# Patient Record
Sex: Female | Born: 1935 | Race: White | Hispanic: No
Health system: Southern US, Community
[De-identification: ages and names within clinical notes are randomized; demographics above are authoritative.]

---

## 2003-11-01 ENCOUNTER — Ambulatory Visit: Payer: Self-pay | Admitting: Internal Medicine

## 2004-02-01 ENCOUNTER — Ambulatory Visit: Payer: Self-pay | Admitting: Oncology

## 2004-02-14 ENCOUNTER — Ambulatory Visit: Payer: Self-pay | Admitting: Oncology

## 2004-07-31 ENCOUNTER — Ambulatory Visit: Payer: Self-pay | Admitting: Oncology

## 2004-08-13 ENCOUNTER — Ambulatory Visit: Payer: Self-pay | Admitting: Oncology

## 2004-11-04 ENCOUNTER — Ambulatory Visit: Payer: Self-pay | Admitting: Internal Medicine

## 2004-12-17 ENCOUNTER — Ambulatory Visit: Payer: Self-pay | Admitting: Internal Medicine

## 2005-02-14 ENCOUNTER — Ambulatory Visit: Payer: Self-pay | Admitting: Oncology

## 2005-02-20 ENCOUNTER — Ambulatory Visit: Payer: Self-pay | Admitting: Gastroenterology

## 2005-03-13 ENCOUNTER — Ambulatory Visit: Payer: Self-pay | Admitting: Oncology

## 2005-05-09 ENCOUNTER — Ambulatory Visit: Payer: Self-pay | Admitting: Gastroenterology

## 2005-06-21 ENCOUNTER — Ambulatory Visit: Payer: Self-pay | Admitting: Internal Medicine

## 2005-06-26 ENCOUNTER — Other Ambulatory Visit: Payer: Self-pay

## 2005-06-27 ENCOUNTER — Inpatient Hospital Stay: Payer: Self-pay | Admitting: Unknown Physician Specialty

## 2005-08-05 ENCOUNTER — Ambulatory Visit: Payer: Self-pay | Admitting: Gastroenterology

## 2005-08-30 ENCOUNTER — Other Ambulatory Visit: Payer: Self-pay

## 2005-08-30 ENCOUNTER — Inpatient Hospital Stay: Payer: Self-pay | Admitting: Internal Medicine

## 2005-09-03 ENCOUNTER — Other Ambulatory Visit: Payer: Self-pay

## 2005-09-03 ENCOUNTER — Emergency Department: Payer: Self-pay | Admitting: Emergency Medicine

## 2005-09-25 ENCOUNTER — Ambulatory Visit: Payer: Self-pay | Admitting: Gastroenterology

## 2005-09-26 ENCOUNTER — Ambulatory Visit: Payer: Self-pay | Admitting: Internal Medicine

## 2005-10-03 ENCOUNTER — Ambulatory Visit: Payer: Self-pay | Admitting: Oncology

## 2005-10-07 ENCOUNTER — Ambulatory Visit: Payer: Self-pay | Admitting: Neurology

## 2005-10-13 ENCOUNTER — Ambulatory Visit: Payer: Self-pay | Admitting: Oncology

## 2005-12-16 ENCOUNTER — Ambulatory Visit: Payer: Self-pay | Admitting: Internal Medicine

## 2005-12-22 ENCOUNTER — Ambulatory Visit: Payer: Self-pay | Admitting: Unknown Physician Specialty

## 2006-04-01 ENCOUNTER — Ambulatory Visit: Payer: Self-pay | Admitting: Oncology

## 2006-04-02 ENCOUNTER — Ambulatory Visit: Payer: Self-pay | Admitting: Oncology

## 2006-04-14 ENCOUNTER — Ambulatory Visit: Payer: Self-pay | Admitting: Oncology

## 2006-10-14 ENCOUNTER — Ambulatory Visit: Payer: Self-pay | Admitting: Oncology

## 2006-10-22 ENCOUNTER — Ambulatory Visit: Payer: Self-pay | Admitting: Oncology

## 2006-11-14 ENCOUNTER — Ambulatory Visit: Payer: Self-pay | Admitting: Oncology

## 2006-12-22 ENCOUNTER — Ambulatory Visit: Payer: Self-pay | Admitting: Internal Medicine

## 2007-01-21 ENCOUNTER — Encounter: Payer: Self-pay | Admitting: Internal Medicine

## 2007-04-22 ENCOUNTER — Ambulatory Visit: Payer: Self-pay | Admitting: Oncology

## 2007-05-06 ENCOUNTER — Ambulatory Visit: Payer: Self-pay | Admitting: Internal Medicine

## 2007-05-14 ENCOUNTER — Ambulatory Visit: Payer: Self-pay | Admitting: Oncology

## 2007-11-23 ENCOUNTER — Emergency Department (HOSPITAL_COMMUNITY): Admission: EM | Admit: 2007-11-23 | Discharge: 2007-11-23 | Payer: Self-pay | Admitting: Emergency Medicine

## 2007-11-25 ENCOUNTER — Emergency Department (HOSPITAL_COMMUNITY): Admission: EM | Admit: 2007-11-25 | Discharge: 2007-11-25 | Payer: Self-pay | Admitting: Emergency Medicine

## 2007-12-10 IMAGING — CR DG ABDOMEN 1V
1 series · 1 of 1 positions shown · non-contrast
Comparison: none

REASON FOR EXAM: vomiting, chest pain
COMMENTS:

[view not recorded]
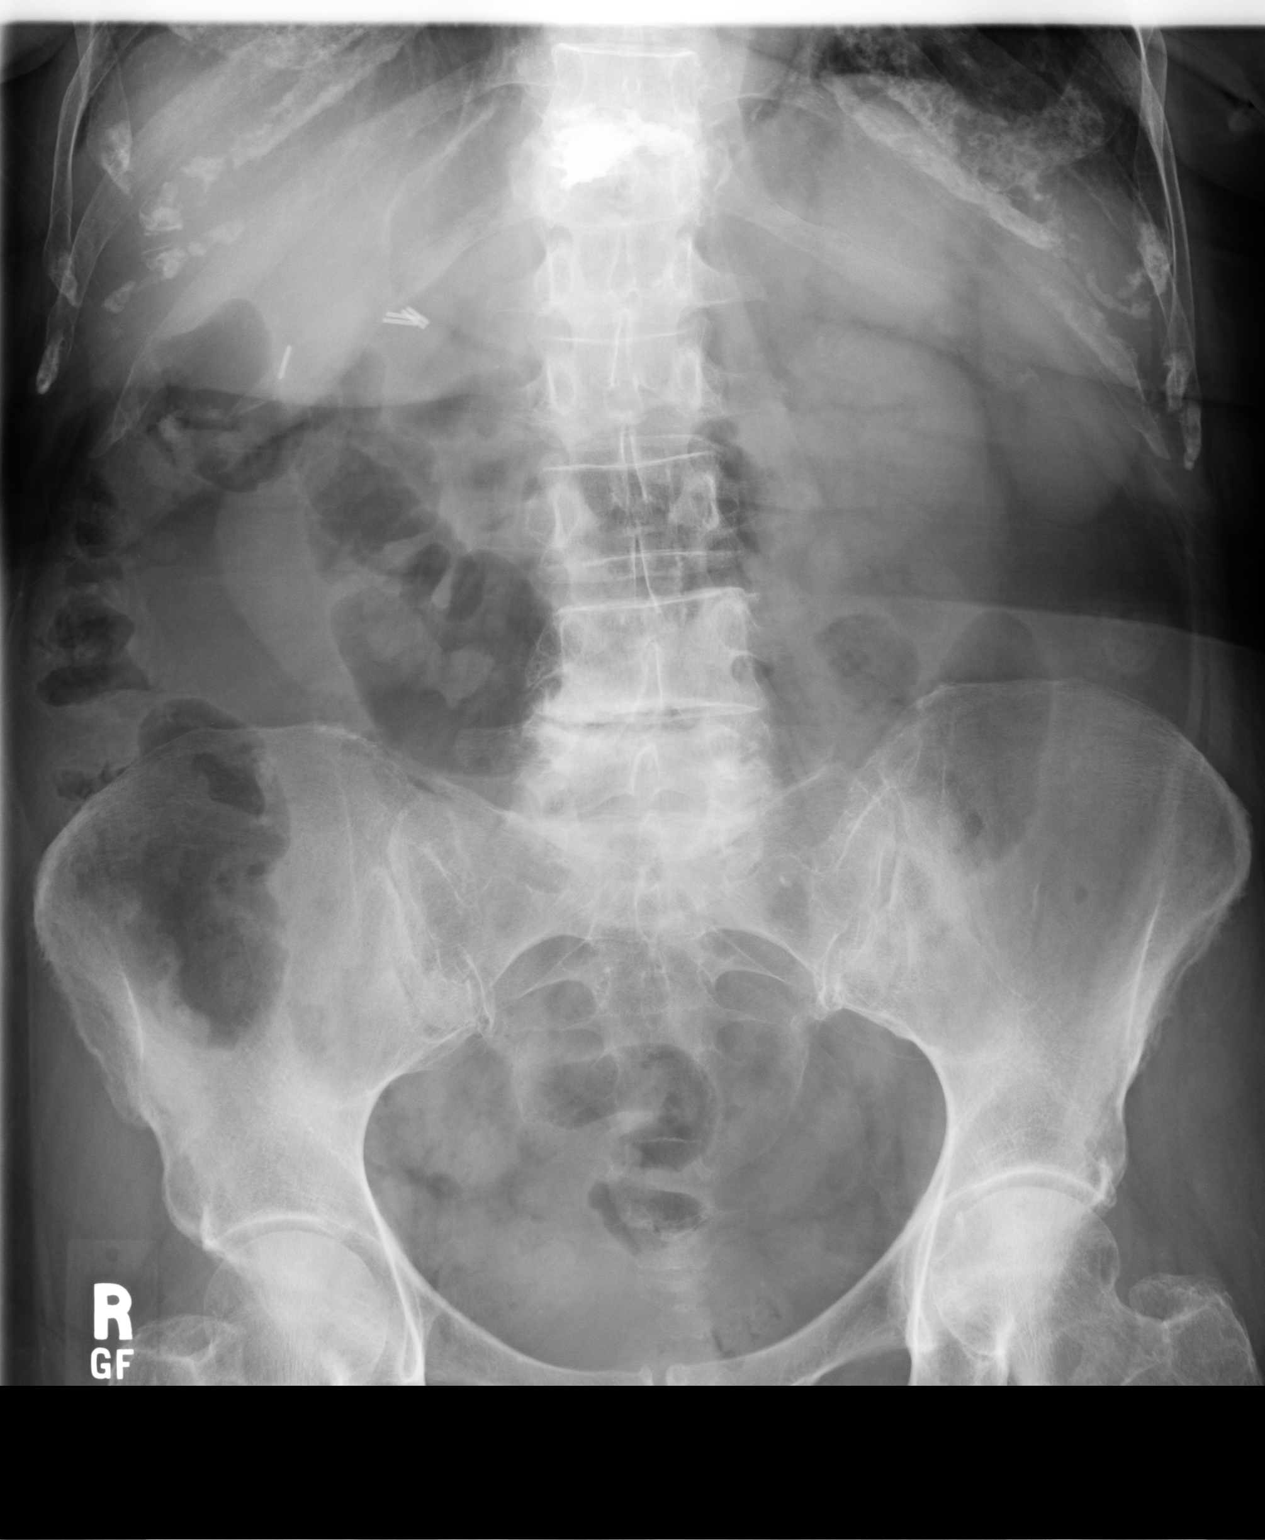

[1 of 1 positions shown; findings below may reference images not displayed]

PROCEDURE:     DXR - DXR KIDNEY URETER BLADDER  - August 30, 2005  [DATE]

RESULT:          Air and stool is seen scattered through the colon down to
the rectum.  Degenerative changes are noted in the spine.  There are areas
of vascular calcification without definite urinary tract stones being
demonstrated.  The sacral arches appear to be intact.  Surgical clips are
seen in the RIGHT upper quadrant.
IMPRESSION: No evidence of bowel obstruction.

## 2008-01-25 ENCOUNTER — Emergency Department: Payer: Self-pay | Admitting: Emergency Medicine

## 2008-11-13 ENCOUNTER — Ambulatory Visit: Payer: Self-pay | Admitting: Oncology

## 2008-12-12 ENCOUNTER — Ambulatory Visit: Payer: Self-pay | Admitting: Oncology

## 2008-12-13 ENCOUNTER — Ambulatory Visit: Payer: Self-pay | Admitting: Oncology

## 2009-02-23 ENCOUNTER — Ambulatory Visit: Payer: Self-pay | Admitting: Internal Medicine

## 2010-10-15 LAB — URINALYSIS, ROUTINE W REFLEX MICROSCOPIC
Ketones, ur: 15 — AB
Ketones, ur: NEGATIVE
Leukocytes, UA: NEGATIVE
Nitrite: NEGATIVE
Protein, ur: 30 — AB
Urobilinogen, UA: 1
Urobilinogen, UA: 1
pH: 7

## 2010-10-15 LAB — DIFFERENTIAL
Basophils Absolute: 0
Eosinophils Absolute: 0
Eosinophils Relative: 0
Lymphocytes Relative: 84 — ABNORMAL HIGH
Lymphs Abs: 32.1 — ABNORMAL HIGH
Neutro Abs: 5.7

## 2010-10-15 LAB — POCT I-STAT, CHEM 8
Chloride: 99
Hemoglobin: 12.2
Sodium: 135
TCO2: 28

## 2010-10-15 LAB — HEPATIC FUNCTION PANEL
ALT: 13
Albumin: 3.4 — ABNORMAL LOW
Alkaline Phosphatase: 66

## 2010-10-15 LAB — URINE MICROSCOPIC-ADD ON

## 2010-10-15 LAB — URINE CULTURE: Colony Count: 60000

## 2010-10-15 LAB — CBC: Platelets: 137 — ABNORMAL LOW

## 2011-01-09 ENCOUNTER — Emergency Department: Payer: Self-pay | Admitting: Emergency Medicine

## 2011-01-13 ENCOUNTER — Ambulatory Visit: Payer: Self-pay | Admitting: Oncology

## 2011-01-17 ENCOUNTER — Ambulatory Visit: Payer: Self-pay | Admitting: Oncology

## 2011-02-14 ENCOUNTER — Ambulatory Visit: Payer: Self-pay | Admitting: Oncology

## 2011-03-14 ENCOUNTER — Emergency Department: Payer: Self-pay | Admitting: Emergency Medicine

## 2011-03-14 LAB — CBC WITH DIFFERENTIAL/PLATELET
Basophil #: 0.1 10*3/uL (ref 0.0–0.1)
Comment - H1-Com1: NORMAL
Comment - H1-Com2: NORMAL
HCT: 36.5 % (ref 35.0–47.0)
HGB: 11.9 g/dL — ABNORMAL LOW (ref 12.0–16.0)
Lymphocyte #: 20.4 10*3/uL — ABNORMAL HIGH (ref 1.0–3.6)
MCH: 34.5 pg — ABNORMAL HIGH (ref 26.0–34.0)
MCHC: 32.7 g/dL (ref 32.0–36.0)
MCV: 106 fL — ABNORMAL HIGH (ref 80–100)
Monocyte #: 0.2 10*3/uL (ref 0.0–0.7)
Monocyte %: 0.8 %
Neutrophil #: 3.2 10*3/uL (ref 1.4–6.5)
RBC: 3.46 10*6/uL — ABNORMAL LOW (ref 3.80–5.20)
RDW: 14.8 % — ABNORMAL HIGH (ref 11.5–14.5)

## 2011-03-14 LAB — URINALYSIS, COMPLETE
Blood: NEGATIVE
Glucose,UR: NEGATIVE mg/dL (ref 0–75)
Nitrite: NEGATIVE
Ph: 5 (ref 4.5–8.0)
Specific Gravity: 1.023 (ref 1.003–1.030)
WBC UR: 1 /HPF (ref 0–5)

## 2011-03-14 LAB — BASIC METABOLIC PANEL
Anion Gap: 7 (ref 7–16)
BUN: 20 mg/dL — ABNORMAL HIGH (ref 7–18)
Chloride: 106 mmol/L (ref 98–107)
EGFR (African American): >60
EGFR (Non-African Amer.): >60
Glucose: 85 mg/dL (ref 65–99)
Osmolality: 289 (ref 275–301)

## 2011-03-14 LAB — CK TOTAL AND CKMB (NOT AT ARMC)
CK, Total: 22 U/L (ref 21–215)
CK-MB: 0.7 ng/mL (ref 0.5–3.6)

## 2011-03-14 LAB — TROPONIN I: Troponin-I: <0.02 ng/mL

## 2011-06-28 ENCOUNTER — Emergency Department: Payer: Self-pay | Admitting: Emergency Medicine

## 2011-06-28 LAB — CBC
HCT: 36.8 % (ref 35.0–47.0)
HGB: 12 g/dL (ref 12.0–16.0)
MCH: 33.5 pg (ref 26.0–34.0)
MCHC: 32.5 g/dL (ref 32.0–36.0)
MCV: 103 fL — ABNORMAL HIGH (ref 80–100)
Platelet: 115 10*3/uL — ABNORMAL LOW (ref 150–440)
RDW: 14.6 % — ABNORMAL HIGH (ref 11.5–14.5)
WBC: 22.3 10*3/uL — ABNORMAL HIGH (ref 3.6–11.0)

## 2011-06-28 LAB — URINALYSIS, COMPLETE
Bacteria: NONE SEEN
Glucose,UR: NEGATIVE mg/dL (ref 0–75)
Hyaline Cast: 1
Protein: NEGATIVE
RBC,UR: 3 /HPF (ref 0–5)
Specific Gravity: 1.013 (ref 1.003–1.030)
Squamous Epithelial: 2

## 2011-06-28 LAB — COMPREHENSIVE METABOLIC PANEL
Bilirubin,Total: 0.7 mg/dL (ref 0.2–1.0)
Calcium, Total: 8.9 mg/dL (ref 8.5–10.1)
Co2: 30 mmol/L (ref 21–32)
Creatinine: 0.87 mg/dL (ref 0.60–1.30)
EGFR (African American): >60
Osmolality: 285 (ref 275–301)
Potassium: 3.7 mmol/L (ref 3.5–5.1)
SGPT (ALT): 11 U/L — ABNORMAL LOW

## 2011-06-28 LAB — CK TOTAL AND CKMB (NOT AT ARMC): CK, Total: 23 U/L (ref 21–215)

## 2011-06-28 LAB — LIPASE, BLOOD: Lipase: 93 U/L (ref 73–393)

## 2011-06-28 LAB — TROPONIN I: Troponin-I: <0.02 ng/mL

## 2012-01-14 ENCOUNTER — Ambulatory Visit: Payer: Self-pay | Admitting: Internal Medicine

## 2012-01-30 ENCOUNTER — Inpatient Hospital Stay: Payer: Self-pay | Admitting: Internal Medicine

## 2012-01-30 LAB — URINALYSIS, COMPLETE
Ketone: NEGATIVE
Ph: 5 (ref 4.5–8.0)
Protein: NEGATIVE
Specific Gravity: 1.012 (ref 1.003–1.030)
Squamous Epithelial: 2

## 2012-01-30 LAB — COMPREHENSIVE METABOLIC PANEL
Albumin: 3.4 g/dL (ref 3.4–5.0)
Anion Gap: 12 (ref 7–16)
Calcium, Total: 8.6 mg/dL (ref 8.5–10.1)
Co2: 23 mmol/L (ref 21–32)
EGFR (African American): >60
EGFR (Non-African Amer.): 54 — ABNORMAL LOW
Osmolality: 286 (ref 275–301)
SGPT (ALT): 247 U/L — ABNORMAL HIGH (ref 12–78)
Total Protein: 6.9 g/dL (ref 6.4–8.2)

## 2012-01-30 LAB — TSH: Thyroid Stimulating Horm: 0.58 u[IU]/mL

## 2012-01-30 LAB — CBC
HGB: 12.5 g/dL (ref 12.0–16.0)
MCHC: 32.3 g/dL (ref 32.0–36.0)
Platelet: 134 10*3/uL — ABNORMAL LOW (ref 150–440)
RDW: 14.9 % — ABNORMAL HIGH (ref 11.5–14.5)

## 2012-01-31 LAB — HEPATIC FUNCTION PANEL A (ARMC)
Albumin: 2.9 g/dL — ABNORMAL LOW (ref 3.4–5.0)
Bilirubin, Direct: 2.3 mg/dL — ABNORMAL HIGH (ref 0.00–0.20)
Bilirubin,Total: 3.1 mg/dL — ABNORMAL HIGH (ref 0.2–1.0)
SGPT (ALT): 218 U/L — ABNORMAL HIGH (ref 12–78)
Total Protein: 6.1 g/dL — ABNORMAL LOW (ref 6.4–8.2)

## 2012-01-31 LAB — BASIC METABOLIC PANEL
Anion Gap: 11 (ref 7–16)
Co2: 22 mmol/L (ref 21–32)
EGFR (African American): 56 — ABNORMAL LOW
Glucose: 136 mg/dL — ABNORMAL HIGH (ref 65–99)
Osmolality: 283 (ref 275–301)
Potassium: 3.9 mmol/L (ref 3.5–5.1)
Sodium: 140 mmol/L (ref 136–145)

## 2012-01-31 LAB — CBC WITH DIFFERENTIAL/PLATELET
Bands: 7 %
HCT: 37.3 % (ref 35.0–47.0)
HGB: 12.1 g/dL (ref 12.0–16.0)
Lymphocytes: 93 %
MCH: 32.4 pg (ref 26.0–34.0)
MCV: 100 fL (ref 80–100)
Platelet: 100 10*3/uL — ABNORMAL LOW (ref 150–440)
RDW: 14.6 % — ABNORMAL HIGH (ref 11.5–14.5)

## 2012-01-31 LAB — LIPID PANEL
Cholesterol: 145 mg/dL (ref 0–200)
HDL Cholesterol: 44 mg/dL (ref 40–60)
Ldl Cholesterol, Calc: 86 mg/dL (ref 0–100)
Triglycerides: 77 mg/dL (ref 0–200)
VLDL Cholesterol, Calc: 15 mg/dL (ref 5–40)

## 2012-01-31 LAB — MAGNESIUM: Magnesium: 2.1 mg/dL

## 2012-02-01 LAB — CBC WITH DIFFERENTIAL/PLATELET
Bands: 8 %
HCT: 32.8 % — ABNORMAL LOW (ref 35.0–47.0)
HGB: 11 g/dL — ABNORMAL LOW (ref 12.0–16.0)
Lymphocytes: 82 %
MCHC: 33.5 g/dL (ref 32.0–36.0)
RDW: 14.9 % — ABNORMAL HIGH (ref 11.5–14.5)
Segmented Neutrophils: 8 %
Variant Lymphocyte - H1-Rlymph: 2 %
WBC: 29.6 10*3/uL — ABNORMAL HIGH (ref 3.6–11.0)

## 2012-02-01 LAB — COMPREHENSIVE METABOLIC PANEL
Albumin: 2.4 g/dL — ABNORMAL LOW (ref 3.4–5.0)
Alkaline Phosphatase: 227 U/L — ABNORMAL HIGH (ref 50–136)
Anion Gap: 10 (ref 7–16)
Bilirubin,Total: 1.3 mg/dL — ABNORMAL HIGH (ref 0.2–1.0)
Chloride: 111 mmol/L — ABNORMAL HIGH (ref 98–107)
Co2: 22 mmol/L (ref 21–32)
EGFR (African American): 60 — ABNORMAL LOW
EGFR (Non-African Amer.): 52 — ABNORMAL LOW
Glucose: 98 mg/dL (ref 65–99)
Potassium: 3.1 mmol/L — ABNORMAL LOW (ref 3.5–5.1)
SGOT(AST): 77 U/L — ABNORMAL HIGH (ref 15–37)
SGPT (ALT): 113 U/L — ABNORMAL HIGH (ref 12–78)
Total Protein: 5.4 g/dL — ABNORMAL LOW (ref 6.4–8.2)

## 2012-02-02 DIAGNOSIS — I059 Rheumatic mitral valve disease, unspecified: Secondary | ICD-10-CM

## 2012-02-02 LAB — COMPREHENSIVE METABOLIC PANEL
Alkaline Phosphatase: 219 U/L — ABNORMAL HIGH (ref 50–136)
Anion Gap: 7 (ref 7–16)
BUN: 16 mg/dL (ref 7–18)
Calcium, Total: 8.6 mg/dL (ref 8.5–10.1)
Co2: 30 mmol/L (ref 21–32)
EGFR (African American): 59 — ABNORMAL LOW
EGFR (Non-African Amer.): 51 — ABNORMAL LOW
Potassium: 2.3 mmol/L — CL (ref 3.5–5.1)
SGOT(AST): 37 U/L (ref 15–37)
Sodium: 140 mmol/L (ref 136–145)
Total Protein: 6.3 g/dL — ABNORMAL LOW (ref 6.4–8.2)

## 2012-02-02 LAB — MAGNESIUM: Magnesium: 1.3 mg/dL — ABNORMAL LOW

## 2012-02-02 LAB — POTASSIUM: Potassium: 2.6 mmol/L — ABNORMAL LOW (ref 3.5–5.1)

## 2012-02-03 LAB — MAGNESIUM: Magnesium: 2 mg/dL

## 2012-02-03 LAB — BASIC METABOLIC PANEL
Anion Gap: 6 — ABNORMAL LOW (ref 7–16)
BUN: 17 mg/dL (ref 7–18)
Calcium, Total: 8.8 mg/dL (ref 8.5–10.1)
Chloride: 107 mmol/L (ref 98–107)
Co2: 30 mmol/L (ref 21–32)
Creatinine: 0.86 mg/dL (ref 0.60–1.30)
EGFR (Non-African Amer.): >60
Glucose: 121 mg/dL — ABNORMAL HIGH (ref 65–99)
Osmolality: 288 (ref 275–301)
Potassium: 3.6 mmol/L (ref 3.5–5.1)
Sodium: 143 mmol/L (ref 136–145)

## 2012-02-03 LAB — POTASSIUM: Potassium: 3.8 mmol/L (ref 3.5–5.1)

## 2012-02-04 LAB — BASIC METABOLIC PANEL
Anion Gap: 7 (ref 7–16)
BUN: 25 mg/dL — ABNORMAL HIGH (ref 7–18)
Calcium, Total: 9.1 mg/dL (ref 8.5–10.1)
Chloride: 109 mmol/L — ABNORMAL HIGH (ref 98–107)
Co2: 31 mmol/L (ref 21–32)
Creatinine: 1.03 mg/dL (ref 0.60–1.30)
EGFR (African American): 57 — ABNORMAL LOW
EGFR (Non-African Amer.): 49 — ABNORMAL LOW
Osmolality: 298 (ref 275–301)

## 2012-02-04 LAB — CBC WITH DIFFERENTIAL/PLATELET
Comment - H1-Com2: NORMAL
Lymphocytes: 89 %
MCV: 98 fL (ref 80–100)
Platelet: 133 10*3/uL — ABNORMAL LOW (ref 150–440)
RBC: 3.33 10*6/uL — ABNORMAL LOW (ref 3.80–5.20)
WBC: 34.4 10*3/uL — ABNORMAL HIGH (ref 3.6–11.0)

## 2012-02-04 LAB — CULTURE, BLOOD (SINGLE)

## 2012-02-05 LAB — BASIC METABOLIC PANEL
Anion Gap: 7 (ref 7–16)
BUN: 29 mg/dL — ABNORMAL HIGH (ref 7–18)
Calcium, Total: 8.8 mg/dL (ref 8.5–10.1)
Chloride: 113 mmol/L — ABNORMAL HIGH (ref 98–107)
Creatinine: 0.99 mg/dL (ref 0.60–1.30)
EGFR (African American): 60 — ABNORMAL LOW
Glucose: 131 mg/dL — ABNORMAL HIGH (ref 65–99)
Potassium: 3.4 mmol/L — ABNORMAL LOW (ref 3.5–5.1)
Sodium: 150 mmol/L — ABNORMAL HIGH (ref 136–145)

## 2012-02-05 LAB — MAGNESIUM: Magnesium: 1.8 mg/dL

## 2012-02-06 LAB — CBC WITH DIFFERENTIAL/PLATELET
Bands: 1 %
Comment - H1-Com2: NORMAL
Eosinophil: 1 %
HCT: 30.5 % — ABNORMAL LOW (ref 35.0–47.0)
HGB: 9.8 g/dL — ABNORMAL LOW (ref 12.0–16.0)
Lymphocytes: 82 %
MCHC: 32.3 g/dL (ref 32.0–36.0)
Platelet: 172 10*3/uL (ref 150–440)
RBC: 3.08 10*6/uL — ABNORMAL LOW (ref 3.80–5.20)
RDW: 15.5 % — ABNORMAL HIGH (ref 11.5–14.5)
Segmented Neutrophils: 14 %

## 2012-02-06 LAB — BASIC METABOLIC PANEL
Anion Gap: 7 (ref 7–16)
BUN: 30 mg/dL — ABNORMAL HIGH (ref 7–18)
Calcium, Total: 8.2 mg/dL — ABNORMAL LOW (ref 8.5–10.1)
Chloride: 112 mmol/L — ABNORMAL HIGH (ref 98–107)
Creatinine: 0.96 mg/dL (ref 0.60–1.30)
Glucose: 121 mg/dL — ABNORMAL HIGH (ref 65–99)
Osmolality: 304 (ref 275–301)

## 2012-02-07 LAB — BASIC METABOLIC PANEL
Anion Gap: 8 (ref 7–16)
BUN: 23 mg/dL — ABNORMAL HIGH (ref 7–18)
Chloride: 108 mmol/L — ABNORMAL HIGH (ref 98–107)
Co2: 28 mmol/L (ref 21–32)
Creatinine: 0.83 mg/dL (ref 0.60–1.30)
EGFR (African American): >60
Potassium: 3.5 mmol/L (ref 3.5–5.1)
Sodium: 144 mmol/L (ref 136–145)

## 2012-02-07 LAB — CULTURE, BLOOD (SINGLE)

## 2012-02-08 LAB — CBC WITH DIFFERENTIAL/PLATELET
HGB: 10 g/dL — ABNORMAL LOW (ref 12.0–16.0)
MCH: 31.7 pg (ref 26.0–34.0)
RBC: 3.16 10*6/uL — ABNORMAL LOW (ref 3.80–5.20)
RDW: 15.2 % — ABNORMAL HIGH (ref 11.5–14.5)
WBC: 40.5 10*3/uL — ABNORMAL HIGH (ref 3.6–11.0)

## 2012-02-14 ENCOUNTER — Ambulatory Visit: Payer: Self-pay | Admitting: Internal Medicine

## 2012-02-24 ENCOUNTER — Ambulatory Visit: Payer: Medicare Other | Admitting: Internal Medicine

## 2012-03-12 ENCOUNTER — Encounter: Payer: Medicare Other | Admitting: Cardiovascular Disease

## 2014-05-05 NOTE — Discharge Summary (Signed)
PATIENT NAME:  Catherine Mccarty, Catherine Mccarty MR#:  [id number] DATE OF BIRTH:  01/17/1923  DATE OF ADMISSION:  01/30/2012 DATE OF DISCHARGE:  02/10/2012  Please see interim discharge summary dictated by Dr. Nemiah Commander on the 22nd of January for more details.   DISCHARGE DIAGNOSES: 1. Sepsis, likely due to urinary tract infection/pneumonia disorder, treated.  2. Acute hypoxic respiratory failure likely secondary to pulmonary edema with echo showing ejection fraction of 55%.  3. Acute on chronic diastolic heart failure with ejection fraction of 55%. Supraventricular tachycardia now resolved.  4. Increased LFTs likely from sepsis, slowly improving. 5. Hyperthyroidism, on Tapazole. TSH within normal limits.  6. Dementia at baseline. 7. Hypernatremia, now resolved.   SECONDARY DIAGNOSES: 1. Chronic lymphocytic leukemia.  2. Hypertension.  3. Peptic ulcer disease.  4. Gastroesophageal reflux disease.  5. Osteoporosis.   CONSULTANTS: 1. Ned Grace, MD - Palliative Care.  2. Speech Therapy.  3. Physical Therapy.   PROCEDURES/RADIOLOGY: CT scan of the abdomen and pelvis with contrast on 17th of January showed possible cystitis. Possible atelectasis at the lung bases.   Chest x-ray on 17th of January showed borderline cardiomegaly with moderate pulmonary vascular prominence. Mild atelectasis versus infiltrate at the lung bases.  Chest x-ray on 19th of January showed pulmonary edema. Infection not excluded.   Chest x-ray on 21st of January showed persistent bilateral pulmonary edema, pneumonia cannot be excluded.   Chest x-ray on 23rd of January showed cardiomegaly with edema, effusion and possibly basilar pneumonia.   2-D echocardiogram on 20th of January showed normal LV function. EF more than 55%. Impaired LV relaxation. Elevated RV systolic pressure at 30 to 40 mmHg.   UA on admission showed 3+ bacteria, 193 WBCs, 3+ leukocyte esterase and positive nitrite. WBC in clumps present.   Urine  culture grew more 1000 colonies of Klebsiella pneumoniae. Blood cultures: Two out of four bottles grew Escherichia coli. Repeat blood cultures on 19th of January x 2 were negative.   HISTORY AND SHORT HOSPITAL COURSE: Please see Dr. Mathews Robinsons dictated history and physical for admission course and see Dr. Prudencio Pair dictated interim summary on 22nd of January for further details of hospital course. Subsequent to Dr. Prudencio Pair dictated interim summary, the patient was found to have hypernatremia for which she was started on D5W and her sodium was slowly improving. She also started eating a little bit and she was getting close to her baseline. As per physical therapy recommendation, skilled nursing home was searched and the family has accepted a bed at one of the nursing homes in [facility] where she will be discharged on 28th of January.   DISCHARGE MEDICATIONS: 1. Xopenex 2 puffs inhaled 4 times a day as needed.  2. Atenolol 100 mg p.o. daily.  3. Vitamin B12 250 mcg p.o. daily.  4. Potassium chloride 7.5 milliequivalents twice a day. 5. Donepezil 10 mg p.o. daily. 6. Omeprazole 20 mg p.o. daily.  7. Lexapro 10 mg p.o. daily.  8. Methimazole 5 mg p.o. daily. 9. Risperidone 0.25 mg p.o. at bedtime.  10. Tylenol 1000 mg p.o. 2 times a day as needed.  11. Eucerin topical cream to effected area twice a day as needed. 12. Gabapentin 100 mg p.o. 4 times a day.  13. Melatonin 1 mg p.o. at bedtime.  14. Triamcinolone 0.1% topical cream to itchy rash twice a day as needed.  15. Hydrochlorothiazide/triamterene 25/37.5 mg half tablet p.o. daily.  16. Carafate 1 gram 2 x a day.  Vitals on the day  of discharge: Temp: 97, HR 73/min, bp 114/74 mmhg, O2 sats 94% on room air  DISCHARGE DIET: Low sodium, low fat, low cholesterol, puree with thin liquids with strict aspiration precaution. Medication in puree. Feeding assistance at all meals. Moisten and flavor foods well with gravy, broth and condiment.  Alternate every 2 to 3 bites of food with sip of liquid via cup. Please help the patient to hold the cup herself with support. Alternate between foods and drinks of different texture, temperature and flavors to entice eating or drinking.    DISCHARGE ACTIVITY: As tolerated.   DISCHARGE INSTRUCTIONS AND FOLLOWUP: The patient was instructed to follow up with her primary care physician, Dr. Dale Durhamharlene Scott, in 1 to 2 weeks. She will need followup with Dr. Mariah MillingGollan from cardiology in 4 to 6 weeks. She will get physical therapy evaluation and management while at the facility.   TOTAL TIME DISCHARGING THIS PATIENT: 55 minutes. ____________________________ Ellamae SiaVipul S. Sherryll BurgerShah, MD vss:sb D: 02/09/2012 23:13:00 ET T: 02/10/2012 07:03:26 ET JOB#: [id number]  cc: Rashaun Curl S. Sherryll BurgerShah, MD, <Dictator> Dale Durhamharlene Scott, MD Antonieta Ibaimothy J. Gollan, MD Ellamae SiaVIPUL S [facility] MD ELECTRONICALLY SIGNED 02/11/2012 17:20

## 2014-05-05 NOTE — H&P (Signed)
PATIENT NAME:  Catherine, Mccarty MR#:  [id number] DATE OF BIRTH:  11/30/1923  DATE OF ADMISSION:  01/30/2012  PRIMARY CARE PHYSICIAN:  Dr. Dale Ostrander.   CHIEF COMPLAINT:  Sent in from assisted living for a shaking episode and temperature.   HISTORY OF PRESENT ILLNESS:  This is an 81 year old female that has history of CLL, hypertension, dementia.  Normally she can walk with a walker and needs coaching with eating.  She is able to follow some simple commands, but not others.  The assisted living takes care of her.  She had a shaking episode.  She was unstable with getting up.  She had a temperature of 100.3 and developed some vomiting.  The patient is unable to give any history.  In the ER,  she was found to have a temperature of 104.9, tachycardic, elevated white count, hypotensive, positive urinalysis, possibility of pneumonia, and hospitalist services were contacted for further evaluation.   PAST MEDICAL HISTORY:  CLL, hypertension, ulcer disease, goiter with hyperthyroidism, gastroesophageal reflux disease, dementia, osteoporosis.   PAST SURGICAL HISTORY:  Cholecystectomy, T12 kyphoplasty.   ALLERGIES:  To CODEINE, MACROBID, MORPHINE AND SULFA.   MEDICATIONS:  As per Prescription Writer include acetaminophen 500 mg 2 tablets twice a day as needed for pain, atenolol 100 mg daily, Carafate 1 gram/10 mL twice daily, aricept 10 mg daily, Lexapro 10 mg daily, Eucerin topical cream to affected area twice a day, Gabapentin 100 mg 4 times a day, hydrochlorothiazide/triamterene 25/37.5 half tablet daily, melatonin 1 mg at bedtime, methimazole 5 mg daily, omeprazole 20 mg daily, potassium chloride 20 mEq/15 mL, 7.5 mL twice daily, risperidone 0.25 mg at bedtime, triamcinolone 0.1% topical cream twice a day, vitamin B12 at 250 mcg daily, Xopenex HFA p.r.n.    SOCIAL HISTORY:  Currently at [facility]  No smoking.  No alcohol.  No drug use.  Used to work as a Engineer, site.   FAMILY  HISTORY:  As per a previous note, sister with breast cancer and heart disease.  The patient's son does not know anything about the patient's parents.    REVIEW OF SYSTEMS:  Unable to obtain secondary to dementia.   PHYSICAL EXAMINATION: VITAL SIGNS:  On presentation included a temperature of 104, pulse 104, respirations 26, initial blood pressure was 155/71, pulse ox 100% on oxygen.  The most recent vital signs included a blood pressure of 95/47.  GENERAL:  No respiratory distress, some coughing.  EYES:  Conjunctivae and lids normal.  Unable to test extraocular muscles.  Pupils equal, round and reactive to light.  EARS, NOSE, MOUTH AND THROAT:  Tympanic membranes, no erythema.  Nasal mucosa, no erythema.  Throat, no erythema.  No exudate seen.  Lips and gums, no lesions.  NECK:  No JVD.  No bruits.  No lymphadenopathy.  No thyromegaly.  No thyroid nodules palpated.  RESPIRATORY:  The patient does not take a good inspiratory effort.  No rhonchi, rales or wheeze heard.  CARDIOVASCULAR:  S1, S2, tachycardic.  No gallops, rubs or murmurs heard.  Carotid upstroke 2+ bilaterally.  No bruits.  Dorsalis pedis pulses 1+ bilaterally.  Trace edema of the lower extremity.  ABDOMEN:  Soft, distended.  No organosplenomegaly.  Normoactive bowel sounds.  LYMPHATIC:  No lymph nodes in the neck.  MUSCULOSKELETAL:  No clubbing.  Trace edema.  No cyanosis.  SKIN:  No ulcers anteriorly.  NEUROLOGIC:  The patient moves all extremities.  Reflexes 1+ bilateral lower extremity.  Babinski equivocal.  PSYCHIATRIC:  Difficult to test secondary to dementia.   LABORATORY AND RADIOLOGICAL DATA:  Includes a chest x-ray that shows borderline cardiomegaly, moderate pulmonary vascular prominence, atelectasis versus infiltrates at the lung base.  Lipase 109.  TSH 0.58, magnesium 1.5.  White blood cell count 46.9, hemoglobin 12.5, hematocrit 38.7, platelet count of 134, glucose 129, BUN 21, creatinine 0.95, sodium 141, potassium  4.5, chloride 106, CO2 of 23, calcium 8.6.  Increased liver function tests, total bilirubin 2.3, alkaline phosphatase 498, ALT 247, AST 590.  Urinalysis, 3+ leukocyte esterase, positive nitrites.  Influenza A and B negative.  CT scan of the abdomen and pelvis with contrast shows mild prominence of the bladder.  Correlate with urinalysis.  Cystitis, mild heterogeneous opacity at the lung bases, may be secondary to atelectasis or infectious or aspiration.  I do not see an EKG on the chart; I will order.    ASSESSMENT AND PLAN:   1.  Clinical sepsis with hypotension, multiple etiologies could be suspected.  With the high fever this could be flu.  I will start Tamiflu 75 mg twice a day.  Also, a positive urinalysis, likely urinary tract infection.  Could also be pneumonia since the patient does live at a facility.  I will get aggressive with antibiotics with Zosyn, vancomycin and Zithromax.  Send off a urine culture and blood cultures.  Give IV fluid bolus and IV fluid hydration.  The patient is a DO NOT RESUSCITATE.  Overall prognosis is poor.  We will hold blood pressure medications at this point.  2.  Increased liver function tests.  Could be secondary to sepsis or viral infection.  We will trend the liver function tests.  CT scan did not show much.  3.  Hypomagnesemia.  We will replace IV.  4.  Hyperthyroidism, on methimazole.  5.  Dementia, on Aricept.  Could be some worsening confusion with that.  6.  Chronic lymphocytic leukemia.  Looking back at old lab tests, patient's normal white count is around 22,000.  It is up at 46,000, so this is more elevated going along with infection and sepsis.  7.  Gastroesophageal reflux disease, on Carafate.    TIME SPENT ON ADMISSION:  50 minutes.   CODE STATUS:  THE PATIENT IS A DO NOT RESUSCITATE.    Son's phone number is [phone].     ____________________________ Herschell Dimesichard J. Renae GlossWieting, MD rjw:ea D: 01/30/2012 22:11:42 ET T: 01/31/2012 04:15:25  ET JOB#: [id number]  cc: Herschell Dimesichard J. Renae GlossWieting, MD, <Dictator> Dale Durhamharlene Scott, MD Salley ScarletICHARD J Danell Vazquez MD ELECTRONICALLY SIGNED 02/07/2012 22:30

## 2015-03-14 DEATH — deceased
